# Patient Record
Sex: Male | Born: 1960 | Race: White | Hispanic: No | Marital: Married | Smoking: Never smoker
Health system: Southern US, Community
[De-identification: ages and names within clinical notes are randomized; demographics above are authoritative.]

## PROBLEM LIST (undated history)

## (undated) DIAGNOSIS — E119 Type 2 diabetes mellitus without complications: Secondary | ICD-10-CM

---

## 2008-08-04 ENCOUNTER — Ambulatory Visit: Payer: Self-pay

## 2008-12-01 ENCOUNTER — Ambulatory Visit: Payer: Self-pay | Admitting: Orthopedic Surgery

## 2008-12-06 ENCOUNTER — Ambulatory Visit: Payer: Self-pay | Admitting: Orthopedic Surgery

## 2012-01-24 ENCOUNTER — Ambulatory Visit: Payer: Self-pay | Admitting: Urology

## 2014-05-17 ENCOUNTER — Other Ambulatory Visit: Payer: Self-pay | Admitting: Orthopedic Surgery

## 2014-05-17 DIAGNOSIS — M545 Low back pain, unspecified: Secondary | ICD-10-CM

## 2014-05-18 ENCOUNTER — Ambulatory Visit
Admission: RE | Admit: 2014-05-18 | Discharge: 2014-05-18 | Disposition: A | Discharge status: Home / Self Care | Payer: BLUE CROSS/BLUE SHIELD | Source: Ambulatory Visit | Attending: Orthopedic Surgery | Admitting: Orthopedic Surgery

## 2014-05-18 DIAGNOSIS — M1288 Other specific arthropathies, not elsewhere classified, other specified site: Secondary | ICD-10-CM | POA: Diagnosis not present

## 2014-05-18 DIAGNOSIS — M47816 Spondylosis without myelopathy or radiculopathy, lumbar region: Secondary | ICD-10-CM | POA: Diagnosis not present

## 2014-05-18 DIAGNOSIS — M545 Low back pain, unspecified: Secondary | ICD-10-CM

## 2014-05-18 DIAGNOSIS — M5126 Other intervertebral disc displacement, lumbar region: Secondary | ICD-10-CM | POA: Diagnosis not present

## 2014-05-18 DIAGNOSIS — M4806 Spinal stenosis, lumbar region: Secondary | ICD-10-CM | POA: Insufficient documentation

## 2014-05-19 ENCOUNTER — Other Ambulatory Visit: Payer: Self-pay | Admitting: Orthopedic Surgery

## 2014-05-19 DIAGNOSIS — M545 Low back pain: Secondary | ICD-10-CM

## 2016-07-09 ENCOUNTER — Emergency Department
Admission: EM | Admit: 2016-07-09 | Discharge: 2016-07-09 | Disposition: A | Discharge status: Other Acute Inpatient Hospital | Payer: BC Managed Care – PPO | Attending: Emergency Medicine | Admitting: Emergency Medicine

## 2016-07-09 ENCOUNTER — Ambulatory Visit (HOSPITAL_COMMUNITY)
Admission: AD | Admit: 2016-07-09 | Discharge: 2016-07-09 | Disposition: A | Discharge status: Home / Self Care | Payer: BC Managed Care – PPO | Source: Other Acute Inpatient Hospital | Attending: Emergency Medicine | Admitting: Emergency Medicine

## 2016-07-09 ENCOUNTER — Emergency Department: Payer: BC Managed Care – PPO

## 2016-07-09 ENCOUNTER — Encounter: Payer: Self-pay | Admitting: Emergency Medicine

## 2016-07-09 DIAGNOSIS — E119 Type 2 diabetes mellitus without complications: Secondary | ICD-10-CM | POA: Diagnosis not present

## 2016-07-09 DIAGNOSIS — Z7982 Long term (current) use of aspirin: Secondary | ICD-10-CM | POA: Insufficient documentation

## 2016-07-09 DIAGNOSIS — Z7984 Long term (current) use of oral hypoglycemic drugs: Secondary | ICD-10-CM | POA: Insufficient documentation

## 2016-07-09 DIAGNOSIS — S2241XA Multiple fractures of ribs, right side, initial encounter for closed fracture: Secondary | ICD-10-CM | POA: Diagnosis not present

## 2016-07-09 DIAGNOSIS — W2209XA Striking against other stationary object, initial encounter: Secondary | ICD-10-CM | POA: Diagnosis not present

## 2016-07-09 DIAGNOSIS — Y998 Other external cause status: Secondary | ICD-10-CM | POA: Diagnosis not present

## 2016-07-09 DIAGNOSIS — T1490XA Injury, unspecified, initial encounter: Secondary | ICD-10-CM | POA: Insufficient documentation

## 2016-07-09 DIAGNOSIS — S32009A Unspecified fracture of unspecified lumbar vertebra, initial encounter for closed fracture: Secondary | ICD-10-CM | POA: Insufficient documentation

## 2016-07-09 DIAGNOSIS — S3994XA Unspecified injury of external genitals, initial encounter: Secondary | ICD-10-CM | POA: Diagnosis present

## 2016-07-09 DIAGNOSIS — Y92003 Bedroom of unspecified non-institutional (private) residence as the place of occurrence of the external cause: Secondary | ICD-10-CM | POA: Diagnosis not present

## 2016-07-09 DIAGNOSIS — W11XXXA Fall on and from ladder, initial encounter: Secondary | ICD-10-CM | POA: Diagnosis not present

## 2016-07-09 DIAGNOSIS — S2241XB Multiple fractures of ribs, right side, initial encounter for open fracture: Secondary | ICD-10-CM

## 2016-07-09 DIAGNOSIS — Y939 Activity, unspecified: Secondary | ICD-10-CM | POA: Insufficient documentation

## 2016-07-09 DIAGNOSIS — R109 Unspecified abdominal pain: Secondary | ICD-10-CM | POA: Insufficient documentation

## 2016-07-09 HISTORY — DX: Type 2 diabetes mellitus without complications: E11.9

## 2016-07-09 LAB — COMPREHENSIVE METABOLIC PANEL
ALK PHOS: 71 U/L (ref 38–126)
ALT: 23 U/L (ref 17–63)
AST: 31 U/L (ref 15–41)
Albumin: 4.3 g/dL (ref 3.5–5.0)
Anion gap: 6 (ref 5–15)
BILIRUBIN TOTAL: 0.6 mg/dL (ref 0.3–1.2)
BUN: 15 mg/dL (ref 6–20)
CALCIUM: 9.4 mg/dL (ref 8.9–10.3)
CO2: 26 mmol/L (ref 22–32)
CREATININE: 1.21 mg/dL (ref 0.61–1.24)
Chloride: 106 mmol/L (ref 101–111)
GFR calc non Af Amer: >60 mL/min (ref >60)
Glucose, Bld: 129 mg/dL — ABNORMAL HIGH (ref 65–99)
Potassium: 3.7 mmol/L (ref 3.5–5.1)
SODIUM: 138 mmol/L (ref 135–145)
Total Protein: 7.9 g/dL (ref 6.5–8.1)

## 2016-07-09 LAB — CBC WITH DIFFERENTIAL/PLATELET
Basophils Absolute: 0.1 10*3/uL (ref 0–0.1)
Basophils Relative: 1 %
EOS ABS: 0.1 10*3/uL (ref 0–0.7)
EOS PCT: 1 %
HCT: 40.9 % (ref 40.0–52.0)
Hemoglobin: 14.1 g/dL (ref 13.0–18.0)
LYMPHS ABS: 2.5 10*3/uL (ref 1.0–3.6)
Lymphocytes Relative: 28 %
MCH: 31 pg (ref 26.0–34.0)
MCHC: 34.5 g/dL (ref 32.0–36.0)
MCV: 90 fL (ref 80.0–100.0)
Monocytes Absolute: 0.7 10*3/uL (ref 0.2–1.0)
Monocytes Relative: 8 %
Neutro Abs: 5.5 10*3/uL (ref 1.4–6.5)
Neutrophils Relative %: 62 %
PLATELETS: 267 10*3/uL (ref 150–440)
RBC: 4.54 MIL/uL (ref 4.40–5.90)
RDW: 13.8 % (ref 11.5–14.5)
WBC: 8.9 10*3/uL (ref 3.8–10.6)

## 2016-07-09 LAB — LIPASE, BLOOD: Lipase: 25 U/L (ref 11–51)

## 2016-07-09 LAB — TYPE AND SCREEN
ABO/RH(D): O POS
Antibody Screen: NEGATIVE

## 2016-07-09 MED ORDER — ONDANSETRON HCL 4 MG/2ML IJ SOLN
4.0000 mg | Freq: Once | INTRAMUSCULAR | Status: AC
  Administered 2016-07-09: 4 mg via INTRAVENOUS

## 2016-07-09 MED ORDER — MORPHINE SULFATE (PF) 4 MG/ML IV SOLN
INTRAVENOUS | Status: AC
  Administered 2016-07-09: 4 mg via INTRAVENOUS
  Filled 2016-07-09: qty 1

## 2016-07-09 MED ORDER — MORPHINE SULFATE (PF) 4 MG/ML IV SOLN
4.0000 mg | Freq: Once | INTRAVENOUS | Status: AC
  Administered 2016-07-09: 4 mg via INTRAVENOUS

## 2016-07-09 MED ORDER — IOPAMIDOL (ISOVUE-300) INJECTION 61%
100.0000 mL | Freq: Once | INTRAVENOUS | Status: AC | PRN
  Administered 2016-07-09: 100 mL via INTRAVENOUS

## 2016-07-09 MED ORDER — ONDANSETRON HCL 4 MG/2ML IJ SOLN
INTRAMUSCULAR | Status: AC
  Administered 2016-07-09: 4 mg via INTRAVENOUS
  Filled 2016-07-09: qty 2

## 2016-07-09 MED ORDER — SODIUM CHLORIDE 0.9 % IV BOLUS (SEPSIS)
1000.0000 mL | Freq: Once | INTRAVENOUS | Status: AC
  Administered 2016-07-09: 1000 mL via INTRAVENOUS

## 2016-07-09 MED ORDER — MORPHINE SULFATE (PF) 4 MG/ML IV SOLN
INTRAVENOUS | Status: AC
  Filled 2016-07-09: qty 1

## 2016-07-09 MED ORDER — MORPHINE SULFATE (PF) 4 MG/ML IV SOLN
4.0000 mg | Freq: Once | INTRAVENOUS | Status: AC
  Administered 2016-07-09: 4 mg via INTRAVENOUS
  Filled 2016-07-09: qty 1

## 2016-07-09 NOTE — ED Triage Notes (Signed)
Pt to ed with c/o fall from ladder hitting his back on the bathtub,  Pt pale at triage.  Pt reports pain in lower right side back pain.  Hx of back fractures.

## 2016-07-09 NOTE — ED Notes (Signed)
Pt taken to CT.

## 2016-07-09 NOTE — ED Notes (Signed)
NAD noted at time of transfer. Pt transferred by [facility]. Pt belongings sent with family.

## 2016-07-09 NOTE — ED Notes (Signed)
Standing on a chair, slipped and fell hitting back on the bathtub. Pt pale, in obvious pain.

## 2016-07-09 NOTE — ED Notes (Signed)
MD at bedside at this time.

## 2016-07-09 NOTE — ED Provider Notes (Signed)
[facility] Emergency Department Provider Note   ____________________________________________   First MD Initiated Contact with Patient 07/09/16 1400     (approximate)  I have reviewed the triage vital signs and the nursing notes.   HISTORY  Chief Complaint Fall    HPI Donald Walter is a 56 y.o. male who was in the bathroom about 2 feet off the ground slipped and fell off a ladder or chair and hit his right side on the rim of the bathtub. He complains of severe pain in the right ribs and low back and CVA area where he hit the tub. His only past medical history is diabetes for which she takes metformin and history of renal colic in the past. He has no allergies to contrast dye no other allergies although morphine and derivatives make him nauseated. He has no other pain he did not pass out he does not have any headache or neck pain did not hit his head. His pain is severe worse with movement or palpation. The injury just happened. Patient reports he just overbalanced and fell.   Past Medical History:  Diagnosis Date  . Diabetes mellitus without complication (HCC)     There are no active problems to display for this patient.   History reviewed. No pertinent surgical history.  Prior to Admission medications   Medication Sig Start Date End Date Taking? Authorizing Provider  aspirin EC 81 MG tablet Take 81 mg by mouth daily.    [provider]  metFORMIN (GLUCOPHAGE-XR) 750 MG 24 hr tablet Take 1 tablet by mouth daily. 05/09/16   [provider]    Allergies Patient has no known allergies.  No family history on file.  Social History Social History  Substance Use Topics  . Smoking status: Never Smoker  . Smokeless tobacco: Never Used  . Alcohol use No    Review of Systems  Constitutional: No fever/chills Eyes: No visual changes. ENT: No sore throat. Cardiovascular: Denies chest pain. Respiratory: Denies shortness of  breath. Gastrointestinal: See history of present illness. Genitourinary: Negative for dysuria. Musculoskeletal: See history of present illness Skin: Negative for rash. Neurological: Negative for headaches, focal weakness or numbness.   ____________________________________________   PHYSICAL EXAM:  VITAL SIGNS: ED Triage Vitals  Enc Vitals Group     BP 07/09/16 1344 119/68     Pulse Rate 07/09/16 1344 61     Resp 07/09/16 1344 18     Temp 07/09/16 1344 97 F (36.1 C)     Temp Source 07/09/16 1344 Oral     SpO2 07/09/16 1344 98 %     Weight 07/09/16 1345 172 lb (78 kg)     Height --      Head Circumference --      Peak Flow --      Pain Score 07/09/16 1344 10     Pain Loc --      Pain Edu? --      Excl. in GC? --    Constitutional: Alert and oriented. Ill appearing and in acute distress. Eyes: Conjunctivae are normal. PERRL. EOMI. Head: Atraumatic. Nose: No congestion/rhinnorhea. Mouth/Throat: Mucous membranes are moist.  Oropharynx non-erythematous. Neck: No stridor.  No cervical spine tenderness to palpation. Hematological/Lymphatic/Immunilogical: No cervical lymphadenopathy. Cardiovascular: Normal rate, regular rhythm. Grossly normal heart sounds.  Good peripheral circulation. Respiratory: Normal respiratory effort.  No retractions. Lungs CTAB. Gastrointestinal: Soft and nontender. No distention. No abdominal bruits. No CVA tenderness. Musculoskeletal: No lower extremity tenderness  nor edema.  No joint effusions. Patient is exquisitely tender over the right lower chest wall and right CVA area. Neurologic:  Normal speech and language. No gross focal neurologic deficits are appreciated.  Skin:  Skin is warm, dry and intact. No rash noted. Psychiatric: Mood and affect are normal. Speech and behavior are normal.  ____________________________________________   LABS (all labs ordered are listed, but only abnormal results are displayed)  Labs Reviewed  COMPREHENSIVE  METABOLIC PANEL - Abnormal; Notable for the following:       Result Value   Glucose, Bld 129 (*)    All other components within normal limits  LIPASE, BLOOD  CBC WITH DIFFERENTIAL/PLATELET  URINALYSIS, COMPLETE (UACMP) WITH MICROSCOPIC  TYPE AND SCREEN   ____________________________________________  EKG   ____________________________________________  RADIOLOGY IMPRESSION: Fractures of the posterior right 11th and 12th ribs and right transverse processes of L1, L2 and L3.  Moderate bilateral lower lobe atelectasis. No evidence of pleural effusion or pneumothorax.  No evidence of acute injury within the abdomen or pelvis.  Mild cardiomegaly and moderate prostate enlargement.  Aortic Atherosclerosis (ICD10-I70.0).   Electronically Signed   By: Harmon PierJeffrey  Hu M.D.   On: 07/09/2016 15:58  ____________________________________________   PROCEDURES  Procedure(s) performed: Procedures bedside ultrasound FAST exam shows no free fluid.  Critical Care performed:   ____________________________________________   INITIAL IMPRESSION / ASSESSMENT AND PLAN / ED COURSE  Pertinent labs & imaging results that were available during my care of the patient were reviewed by me and considered in my medical decision making (see chart for details).  Discussed with surgeon on call Dr. Elenor LegatoPavone. He recommends transfer to a trauma center because he is worried that there is a small chance that this gentleman could develop a spinal cord contusion along with his transverse processes fractures      ____________________________________________   FINAL CLINICAL IMPRESSION(S) / ED DIAGNOSES  Final diagnoses:  Open fracture of multiple ribs of right side, initial encounter  Closed fracture of transverse process of lumbar vertebra, initial encounter (HCC)      NEW MEDICATIONS STARTED DURING THIS VISIT:  New Prescriptions   No medications on file     Note:  This document was  prepared using Dragon voice recognition software and may include unintentional dictation errors.    Arnaldo NatalMalinda, Tc Kapusta F, MD 07/09/16 [phone]

## 2018-02-15 IMAGING — CT CT ABD-PELV W/ CM
2 of 5 series · 15 of 46 positions shown, 17 images · IV contrast (iopamidol)
Comparison: 02/01/2012 abdominal and CT. 05/18/2014 lumbar spine MR

CLINICAL DATA: 56-year-old male with chest,, back, abdomen and
pelvic pain following fall from ladder. Initial encounter.

EXAM:
CT CHEST, ABDOMEN, AND PELVIS WITH CONTRAST
TECHNIQUE: Multidetector CT imaging of the chest, abdomen and pelvis was
performed following the standard protocol during bolus
administration of intravenous contrast.
CONTRAST:  100mL [id number] IOPAMIDOL ([id number]) INJECTION 61%

[Series 2: cap with · axial · 0.78mm/px · z∈[-589,-74]mm · 12 of 123 slices shown, 14 images]
[im 10/123  soft-tissue]
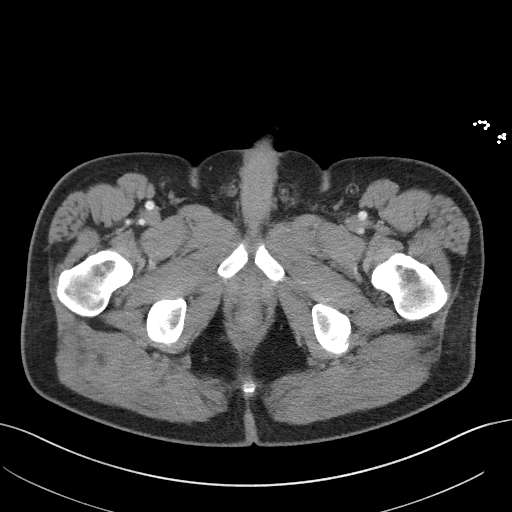
[im 10/123  bone]
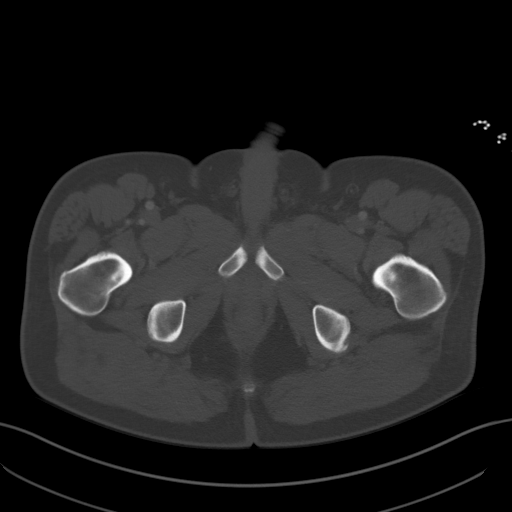
[im 19/123  soft-tissue]
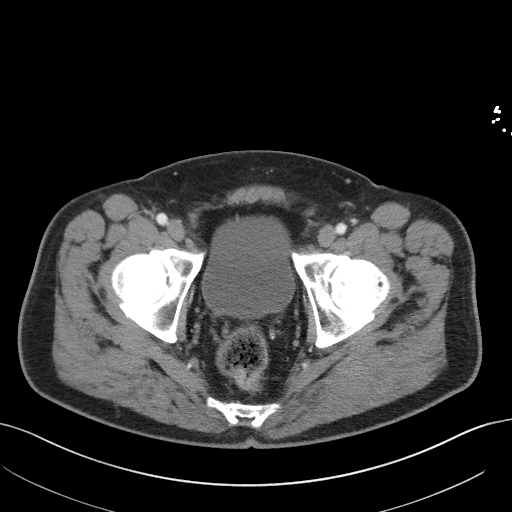
[im 29/123  soft-tissue]
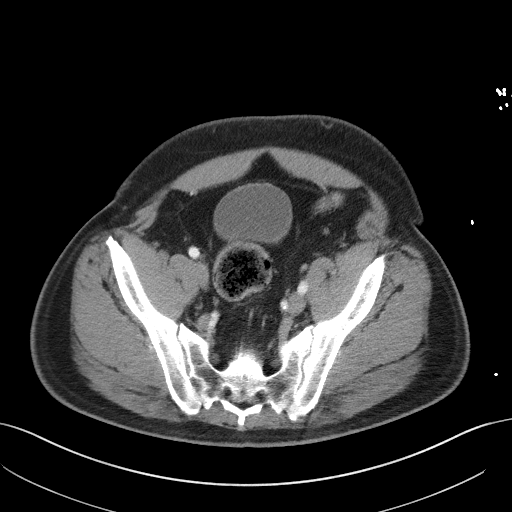
[im 38/123  soft-tissue]
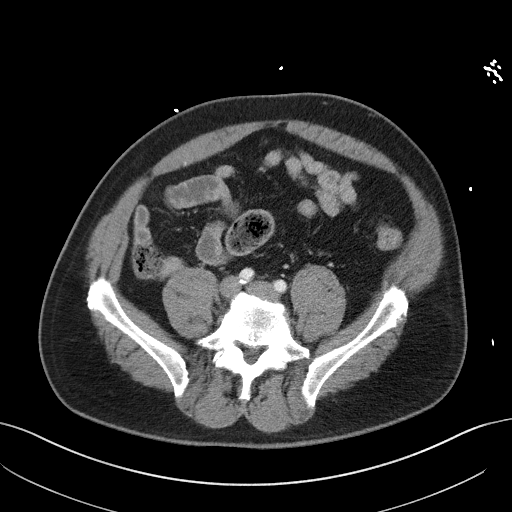
[im 47/123  soft-tissue]
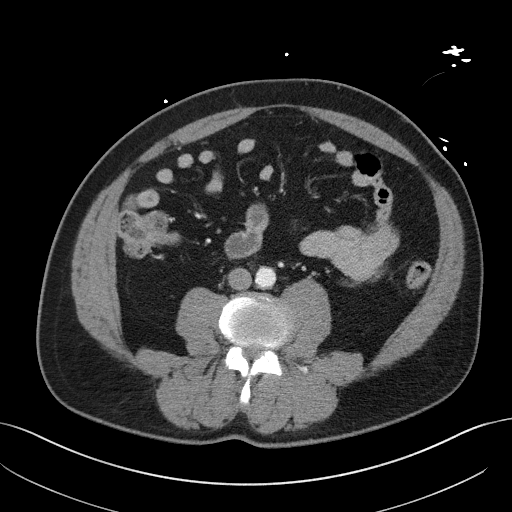
[im 57/123  soft-tissue]
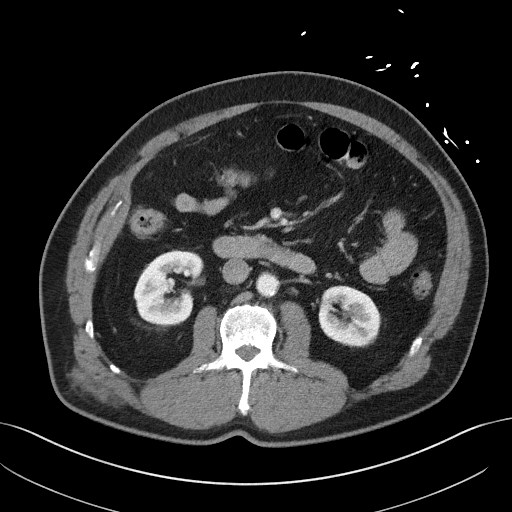
[im 66/123  soft-tissue]
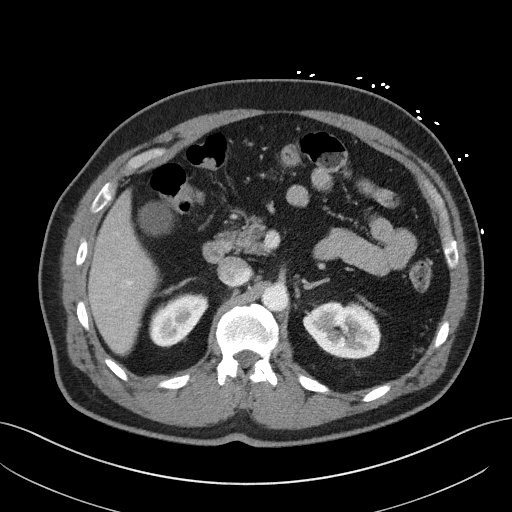
[im 76/123  soft-tissue]
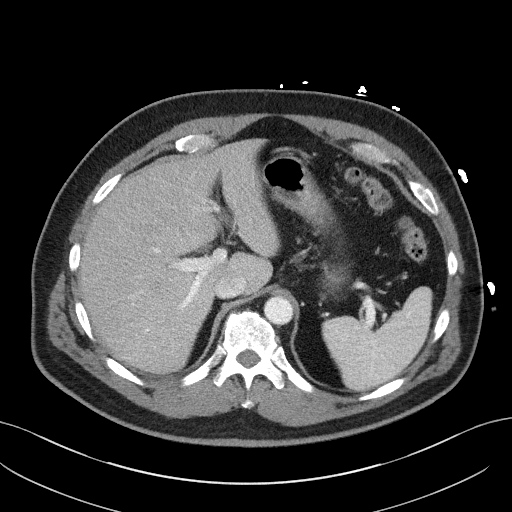
[im 85/123  soft-tissue]
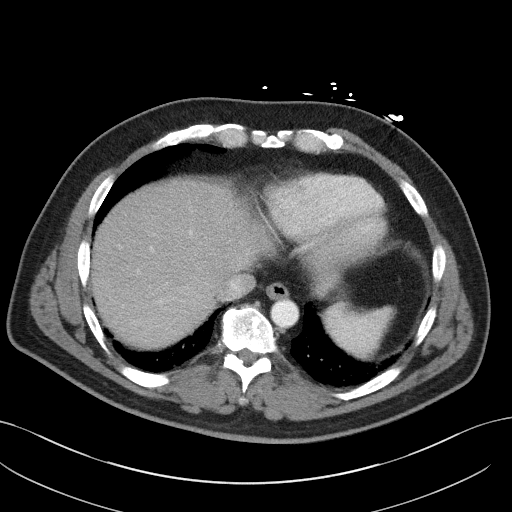
[im 85/123  bone]
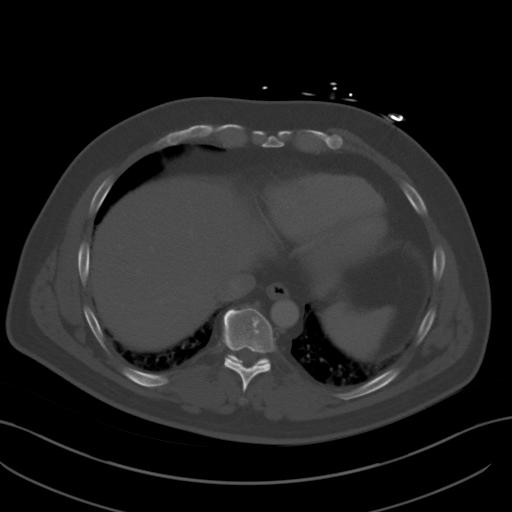
[im 94/123  soft-tissue]
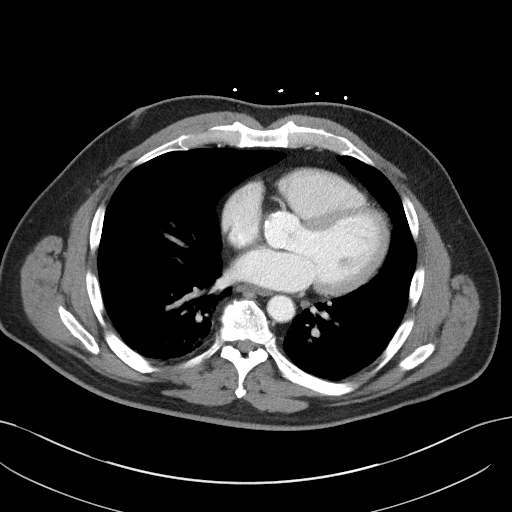
[im 104/123  soft-tissue]
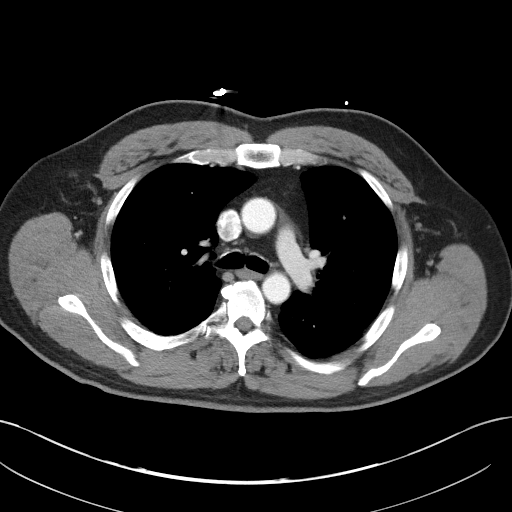
[im 113/123  soft-tissue]
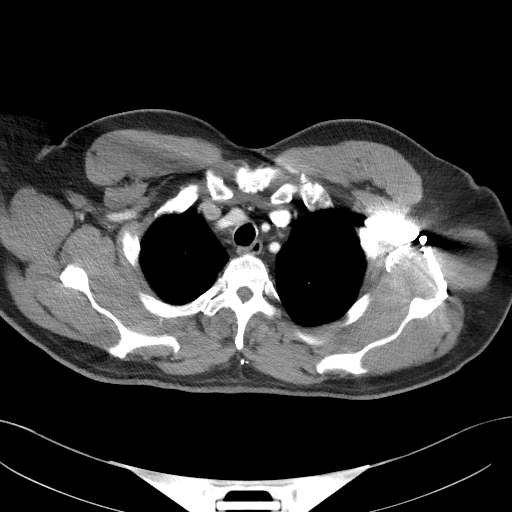

[Series 5: coronals · coronal · 0.82mm/px · 3 of 136 slices shown]
[im 46/136  soft-tissue]
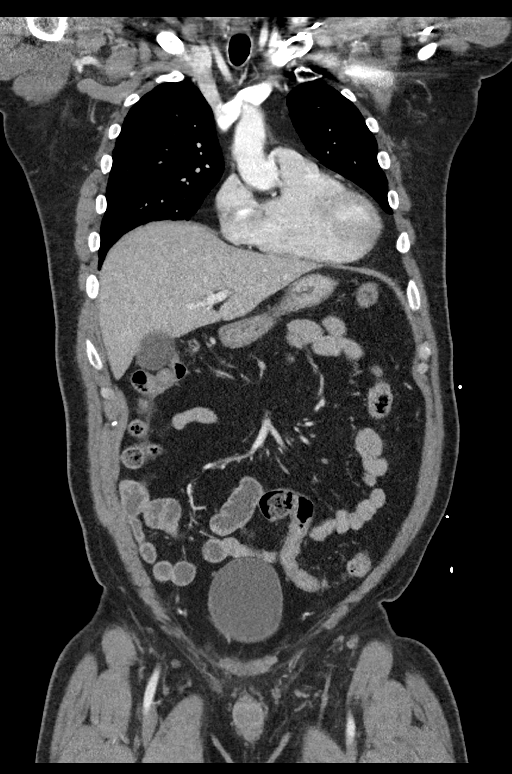
[im 61/136  soft-tissue]
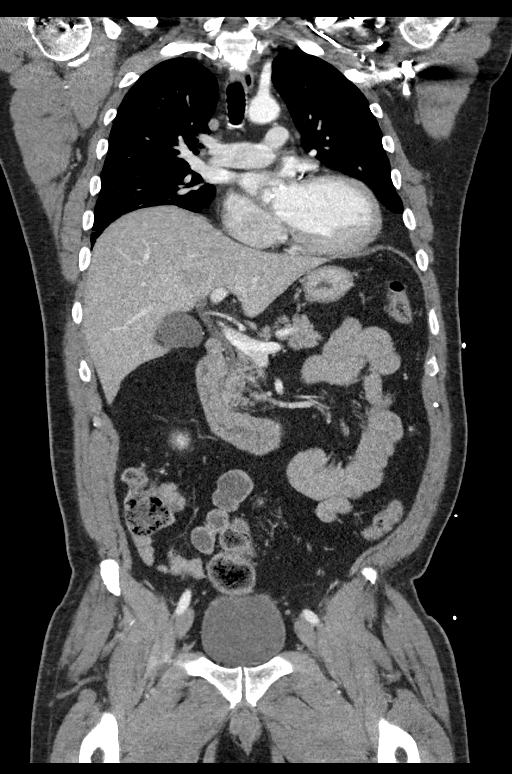
[im 76/136  soft-tissue]
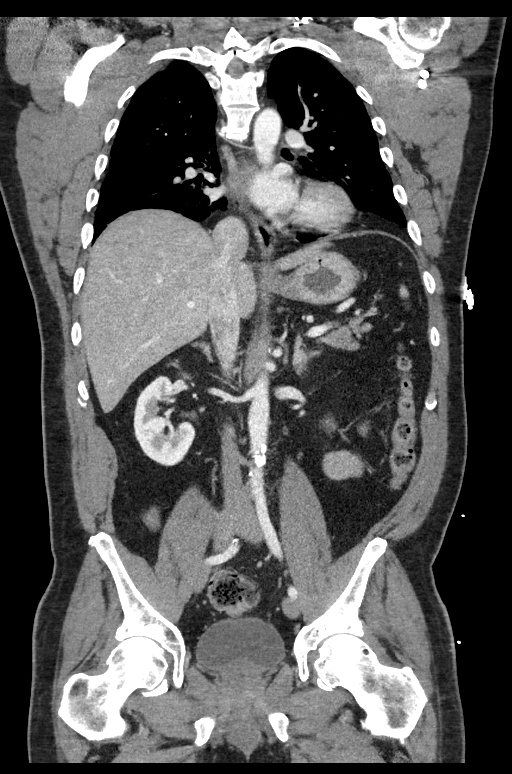

[15 of 46 positions shown; findings below may reference images not displayed]

FINDINGS: CT CHEST FINDINGS

Cardiovascular: Mild cardiomegaly noted. Mild thoracic aortic
atherosclerotic calcifications noted without aneurysm. No
pericardial effusion.

Mediastinum/Nodes: No mediastinal hematoma. No enlarged mediastinal,
hilar, or axillary lymph nodes. Thyroid gland, trachea, and
esophagus demonstrate no significant findings.

Lungs/Pleura: Dependent and lower lobe atelectasis noted. No
airspace disease, pleural effusion, mass or pneumothorax.

Musculoskeletal: Fractures of the posterior right 11th and 12th ribs
noted.

CT ABDOMEN PELVIS FINDINGS

Hepatobiliary: The liver and gallbladder are unremarkable. No
biliary dilatation.

Pancreas: Unremarkable

Spleen: Unremarkable

Adrenals/Urinary Tract: The kidneys, adrenal glands and bladder are
unremarkable.

Stomach/Bowel: Stomach is within normal limits. Appendix appears
normal. No evidence of bowel wall thickening, distention, or
inflammatory changes.

Vascular/Lymphatic: Aortic atherosclerosis. No enlarged abdominal or
pelvic lymph nodes.

Reproductive: Moderate prostate enlargement again noted

Other: No free fluid, focal collection or pneumoperitoneum.

Musculoskeletal: Fractures of the right transverse processes of L1,
L2 and L3 noted. No other fractures identified. Mild moderate
degenerative disc disease at L4-5 noted.
IMPRESSION: Fractures of the posterior right 11th and 12th ribs and right
transverse processes of L1, L2 and L3.

Moderate bilateral lower lobe atelectasis. No evidence of pleural
effusion or pneumothorax.

No evidence of acute injury within the abdomen or pelvis.

Mild cardiomegaly and moderate prostate enlargement.

Aortic Atherosclerosis ([id number].U).

## 2019-01-28 ENCOUNTER — Other Ambulatory Visit: Payer: Self-pay | Admitting: Family Medicine

## 2019-01-28 DIAGNOSIS — R1011 Right upper quadrant pain: Secondary | ICD-10-CM

## 2019-01-30 ENCOUNTER — Other Ambulatory Visit: Payer: Self-pay

## 2019-01-30 ENCOUNTER — Ambulatory Visit
Admission: RE | Admit: 2019-01-30 | Discharge: 2019-01-30 | Disposition: A | Discharge status: Home / Self Care | Payer: BC Managed Care – PPO | Source: Ambulatory Visit | Attending: Family Medicine | Admitting: Family Medicine

## 2019-01-30 DIAGNOSIS — R1011 Right upper quadrant pain: Secondary | ICD-10-CM | POA: Insufficient documentation

## 2019-02-09 ENCOUNTER — Other Ambulatory Visit: Payer: Self-pay | Admitting: Family Medicine

## 2019-02-09 DIAGNOSIS — Z0000 Encounter for general adult medical examination without abnormal findings: Secondary | ICD-10-CM

## 2019-02-09 DIAGNOSIS — R1011 Right upper quadrant pain: Secondary | ICD-10-CM

## 2020-09-07 IMAGING — US US ABDOMEN COMPLETE
1 series · 13 of 25 positions shown · non-contrast
Comparison: CT abdomen and pelvis July 09, 2016.

CLINICAL DATA: Upper abdominal pain

EXAM:
ABDOMEN ULTRASOUND COMPLETE

[Series 1: us abdomen complete · 0.25mm/px · 13 of 73 slices shown]
[im 1/73]
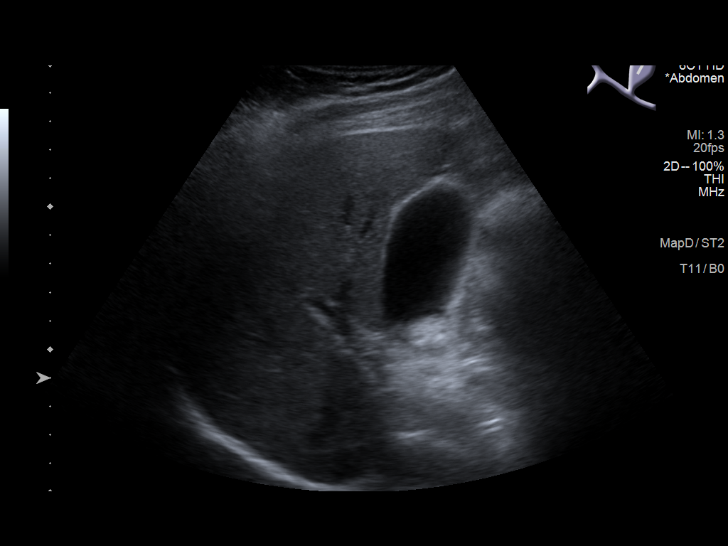
[im 7/73]
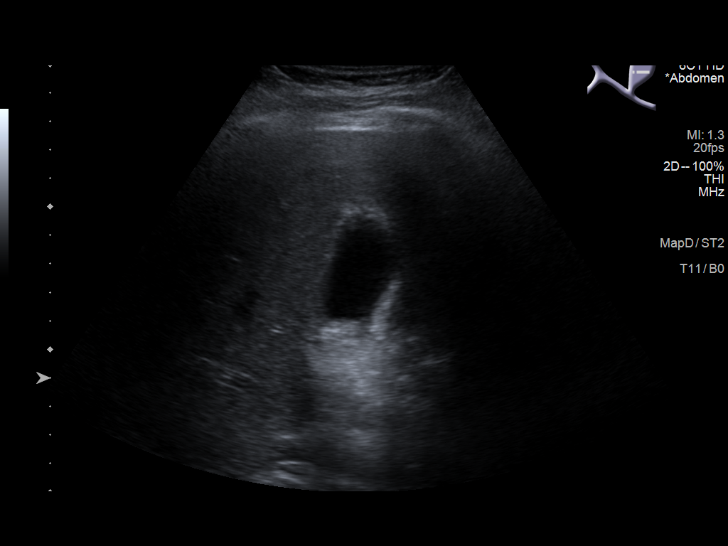
[im 13/73]
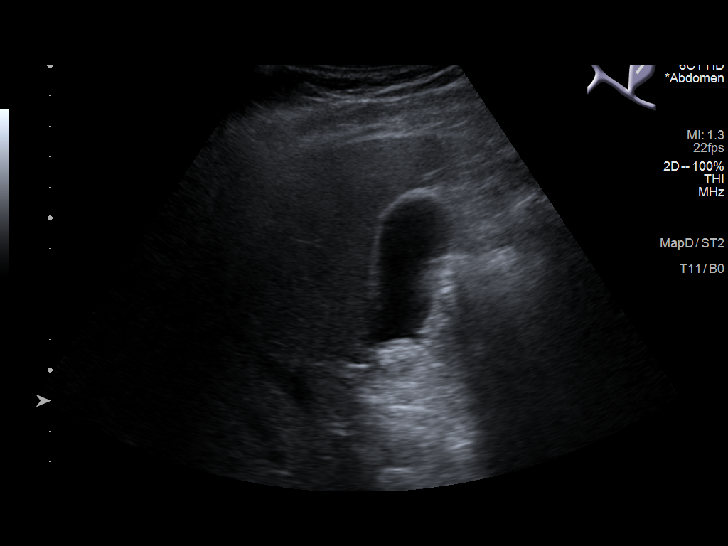
[im 19/73]
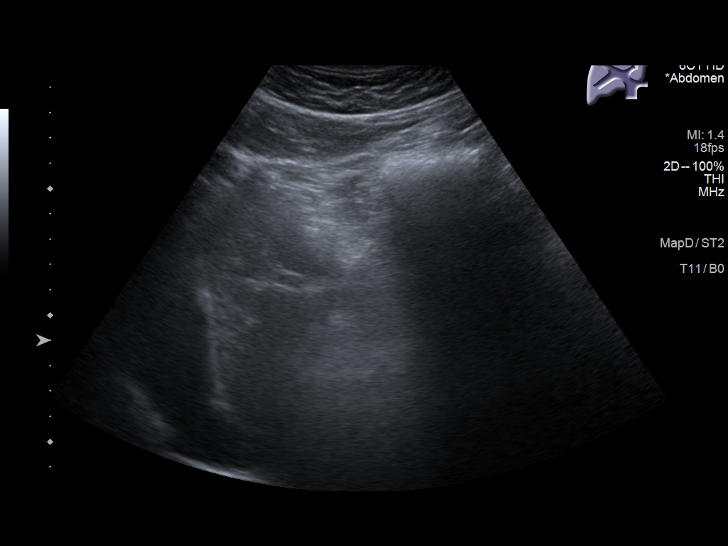
[im 25/73]
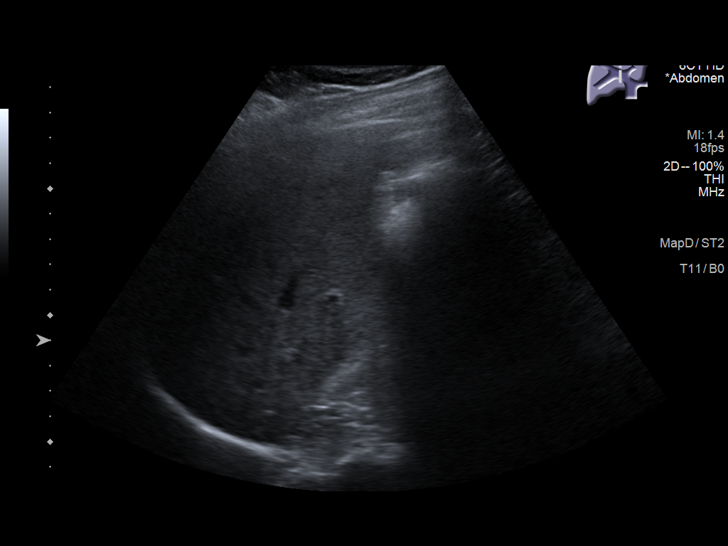
[im 31/73]
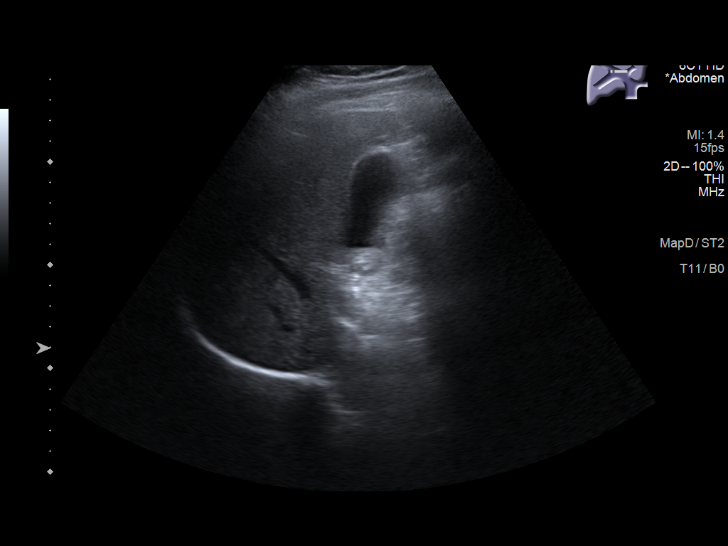
[im 37/73]
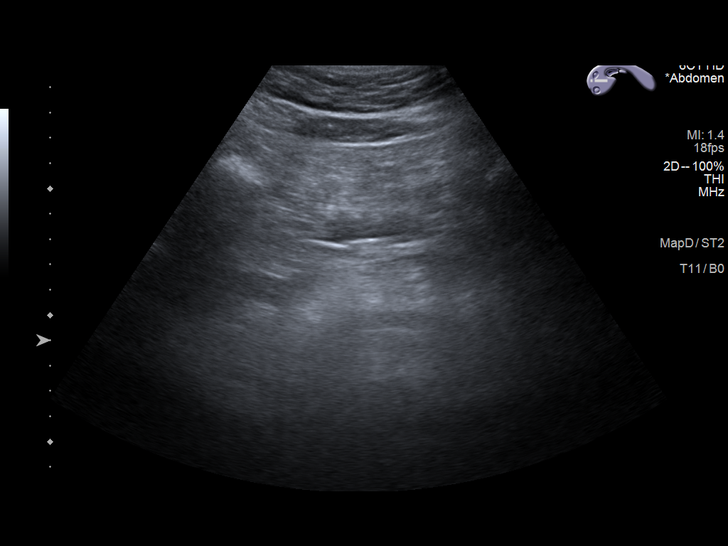
[im 43/73]
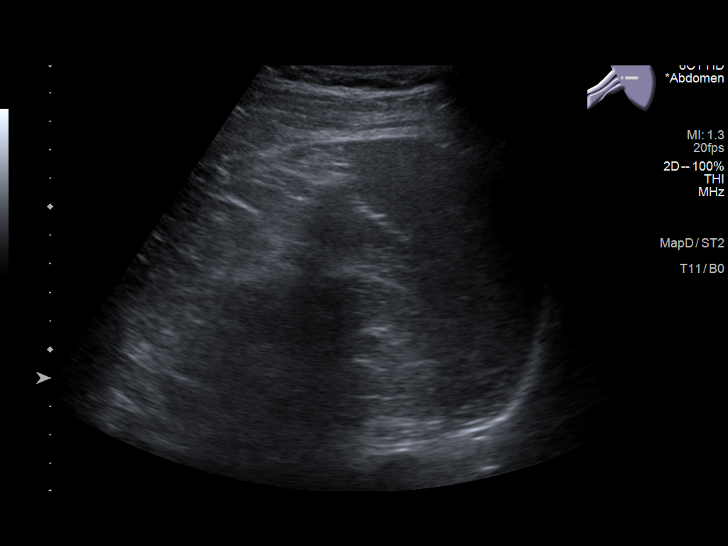
[im 49/73]
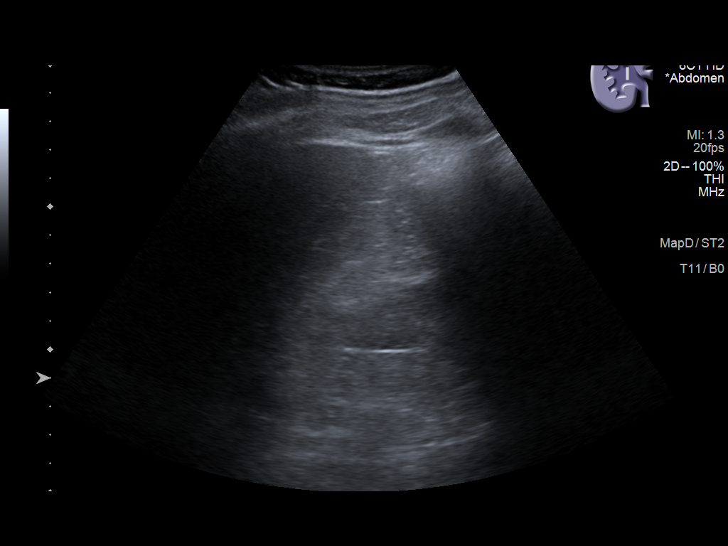
[im 55/73]
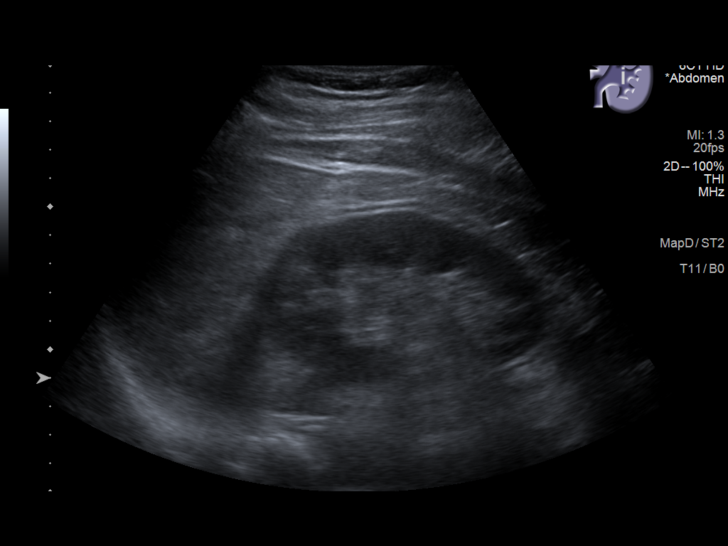
[im 61/73]
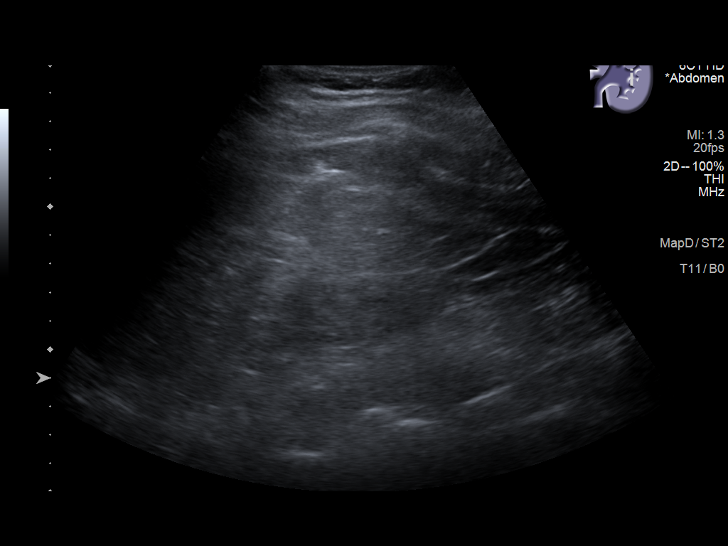
[im 67/73]
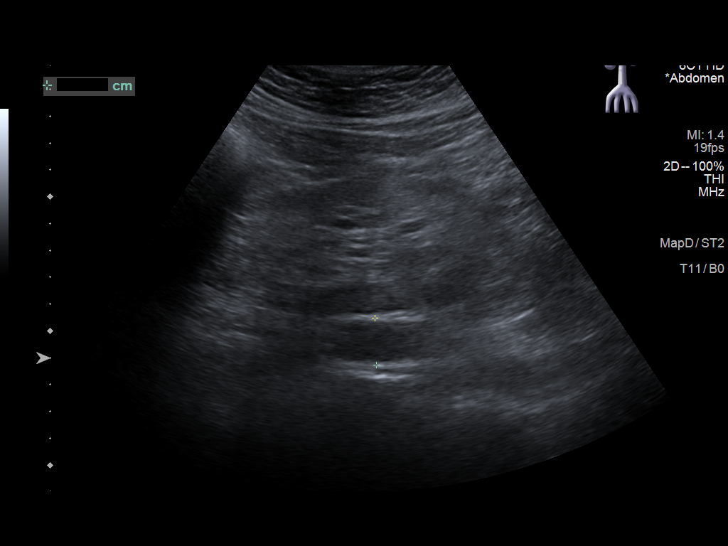
[im 73/73]
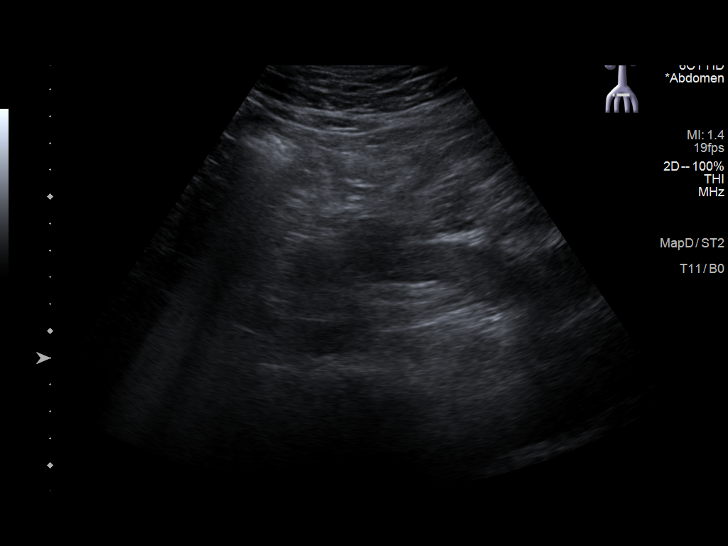

[13 of 25 positions shown; findings below may reference images not displayed]

FINDINGS: Gallbladder: Within the gallbladder, there is an echogenic focus
which moves in shadows measuring 2 cm in length, consistent with
either one fairly large or multiple smaller adherent gallstones.
There is no appreciable gallbladder wall thickening or
pericholecystic fluid. No sonographic Murphy sign noted by
sonographer.

Common bile duct: Diameter: 5 mm. No intrahepatic or extrahepatic
biliary duct dilatation.

Liver: No focal lesion identified. Within normal limits in
parenchymal echogenicity. Portal vein is patent on color Doppler
imaging with normal direction of blood flow towards the liver.

IVC: No abnormality visualized.

Pancreas: Visualized portion unremarkable. Portions of pancreas
obscured by gas.

Spleen: Size and appearance within normal limits.

Right Kidney: Length: 11.7 cm. Echogenicity within normal limits. No
mass or hydronephrosis visualized.

Left Kidney: Length: 10.6 cm. Echogenicity within normal limits. No
mass or hydronephrosis visualized.

Abdominal aorta: No aneurysm visualized.

Other findings: No demonstrable ascites. No abdominal mass evident.
Note that there are areas of the abdomen which are obscured by gas.
IMPRESSION: 1. Cholelithiasis. No gallbladder wall thickening or pericholecystic
fluid.

2. Portions of pancreas obscured by gas. Visualized portions of
pancreas appear within normal limits.

3.  Study otherwise unremarkable.
